# Patient Record
Sex: Male | Born: 1945 | Race: White | Hispanic: No | Marital: Married | Smoking: Former smoker
Health system: Southern US, Community
[De-identification: ages and names within clinical notes are randomized; demographics above are authoritative.]

## PROBLEM LIST (undated history)

## (undated) ENCOUNTER — Ambulatory Visit: Discharge status: Absent without leave | Payer: Medicare Other

## (undated) DIAGNOSIS — F0390 Unspecified dementia without behavioral disturbance: Secondary | ICD-10-CM

## (undated) DIAGNOSIS — I10 Essential (primary) hypertension: Secondary | ICD-10-CM

## (undated) DIAGNOSIS — I2510 Atherosclerotic heart disease of native coronary artery without angina pectoris: Secondary | ICD-10-CM

## (undated) DIAGNOSIS — E785 Hyperlipidemia, unspecified: Secondary | ICD-10-CM

## (undated) HISTORY — PX: TONSILLECTOMY: SUR1361

## (undated) HISTORY — PX: CARDIAC SURGERY: SHX584

## (undated) HISTORY — PX: OTHER SURGICAL HISTORY: SHX169

## (undated) HISTORY — PX: BILATERAL CARPAL TUNNEL RELEASE: SHX6508

---

## 1998-04-05 ENCOUNTER — Ambulatory Visit (HOSPITAL_BASED_OUTPATIENT_CLINIC_OR_DEPARTMENT_OTHER): Admission: RE | Admit: 1998-04-05 | Discharge: 1998-04-05 | Payer: Self-pay | Admitting: Orthopedic Surgery

## 1998-06-01 ENCOUNTER — Emergency Department (HOSPITAL_COMMUNITY): Admission: EM | Admit: 1998-06-01 | Discharge: 1998-06-01 | Payer: Self-pay | Admitting: *Deleted

## 1998-06-04 ENCOUNTER — Emergency Department (HOSPITAL_COMMUNITY): Admission: EM | Admit: 1998-06-04 | Discharge: 1998-06-04 | Payer: Self-pay | Admitting: Emergency Medicine

## 1998-06-08 ENCOUNTER — Ambulatory Visit (HOSPITAL_COMMUNITY): Admission: RE | Admit: 1998-06-08 | Discharge: 1998-06-08 | Payer: Self-pay | Admitting: General Surgery

## 1998-06-21 ENCOUNTER — Ambulatory Visit (HOSPITAL_COMMUNITY): Admission: RE | Admit: 1998-06-21 | Discharge: 1998-06-21 | Payer: Self-pay | Admitting: General Surgery

## 2001-06-26 ENCOUNTER — Ambulatory Visit (HOSPITAL_BASED_OUTPATIENT_CLINIC_OR_DEPARTMENT_OTHER): Admission: RE | Admit: 2001-06-26 | Discharge: 2001-06-26 | Payer: Self-pay | Admitting: Orthopedic Surgery

## 2002-02-19 ENCOUNTER — Inpatient Hospital Stay (HOSPITAL_COMMUNITY): Admission: AD | Admit: 2002-02-19 | Discharge: 2002-03-01 | Payer: Self-pay | Admitting: Internal Medicine

## 2002-02-20 ENCOUNTER — Encounter: Payer: Self-pay | Admitting: Cardiology

## 2002-02-22 ENCOUNTER — Encounter: Payer: Self-pay | Admitting: Surgery

## 2002-02-23 ENCOUNTER — Encounter: Payer: Self-pay | Admitting: Surgery

## 2002-02-24 ENCOUNTER — Encounter: Payer: Self-pay | Admitting: Surgery

## 2002-08-13 ENCOUNTER — Ambulatory Visit (HOSPITAL_COMMUNITY): Admission: RE | Admit: 2002-08-13 | Discharge: 2002-08-13 | Payer: Self-pay | Admitting: *Deleted

## 2004-10-24 ENCOUNTER — Other Ambulatory Visit: Payer: Self-pay

## 2004-10-24 ENCOUNTER — Emergency Department: Payer: Self-pay | Admitting: Emergency Medicine

## 2004-12-07 ENCOUNTER — Emergency Department (HOSPITAL_COMMUNITY): Admission: EM | Admit: 2004-12-07 | Discharge: 2004-12-07 | Payer: Self-pay | Admitting: Emergency Medicine

## 2005-01-07 ENCOUNTER — Ambulatory Visit (HOSPITAL_COMMUNITY): Admission: RE | Admit: 2005-01-07 | Discharge: 2005-01-07 | Payer: Self-pay | Admitting: General Surgery

## 2005-01-14 ENCOUNTER — Encounter (INDEPENDENT_AMBULATORY_CARE_PROVIDER_SITE_OTHER): Payer: Self-pay | Admitting: *Deleted

## 2005-01-14 ENCOUNTER — Ambulatory Visit (HOSPITAL_COMMUNITY): Admission: RE | Admit: 2005-01-14 | Discharge: 2005-01-15 | Payer: Self-pay | Admitting: General Surgery

## 2006-10-25 IMAGING — CT CT ABDOMEN W/ CM
1 of 3 series · 15 of 32 positions shown, 20 images · IV contrast (omnipaque)
Comparison: None

ABDOMEN CT WITH CONTRAST:

CLINICAL DATA: Upper abdominal pain
TECHNIQUE: Multidetector CT imaging of the abdomen and pelvis was performed
following the standard protocol during bolus administration of intravenous
contrast.

Contrast:  100 cc Omnipaque 300

[Series 2: routine abdomen · axial · 0.84mm/px · z∈[-528,-118]mm · 15 of 94 slices shown, 20 images]
[im 6/94  soft-tissue]
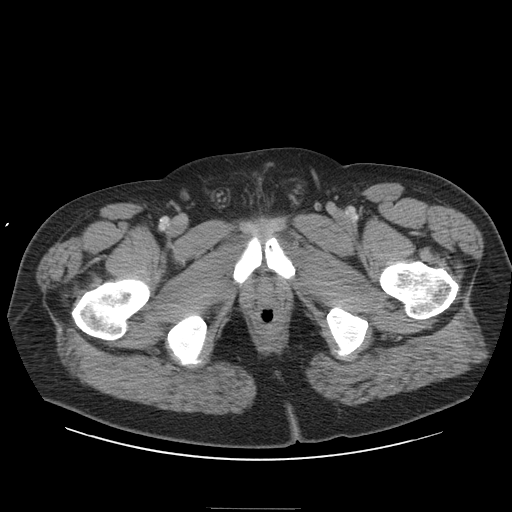
[im 6/94  bone]
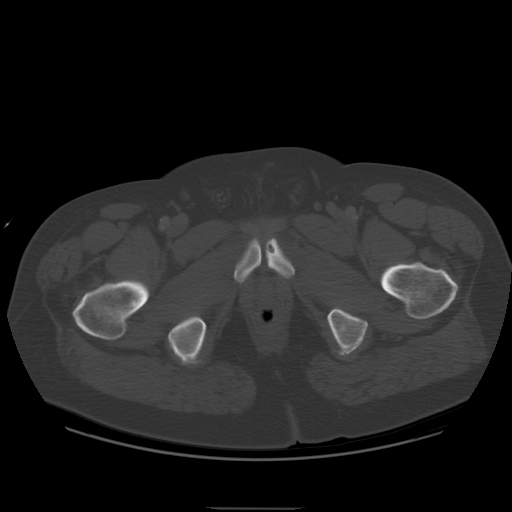
[im 11/94  soft-tissue]
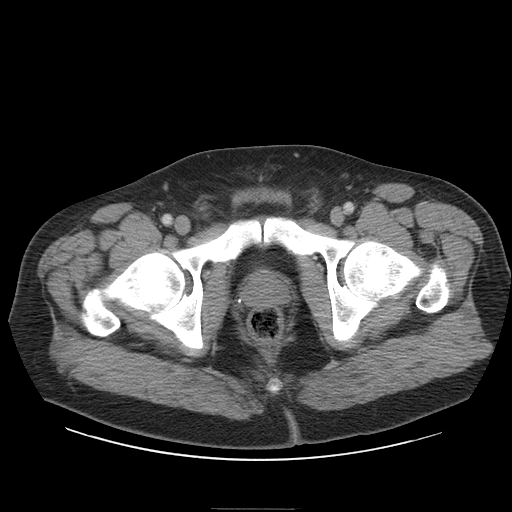
[im 21/94  soft-tissue]
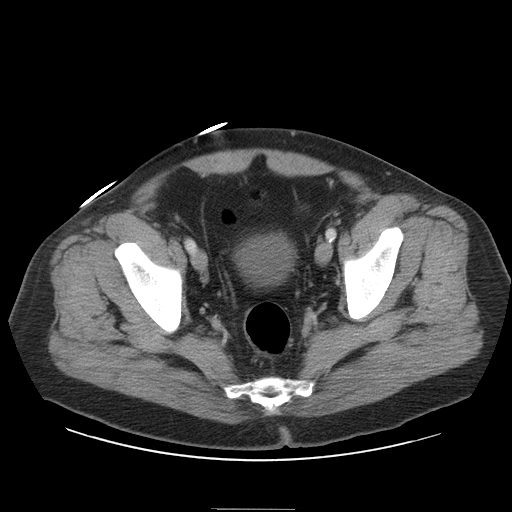
[im 26/94  soft-tissue]
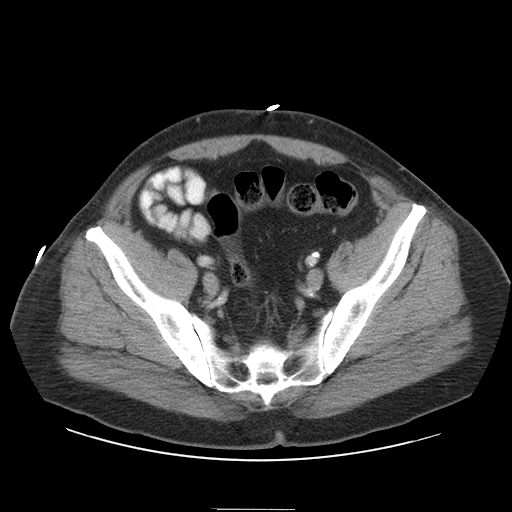
[im 32/94  soft-tissue]
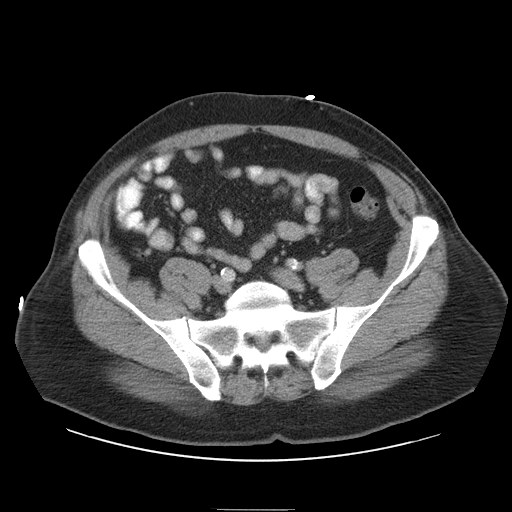
[im 37/94  soft-tissue]
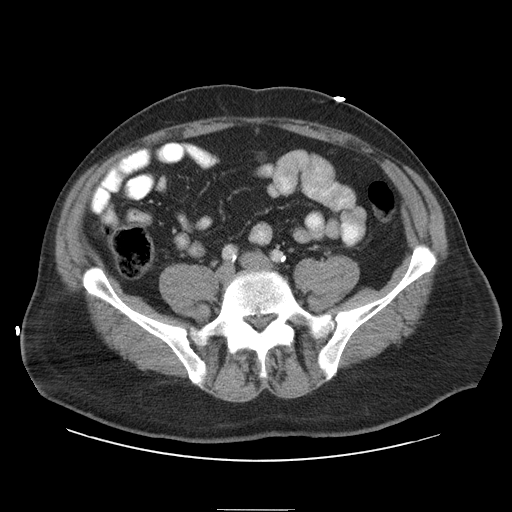
[im 42/94  soft-tissue]
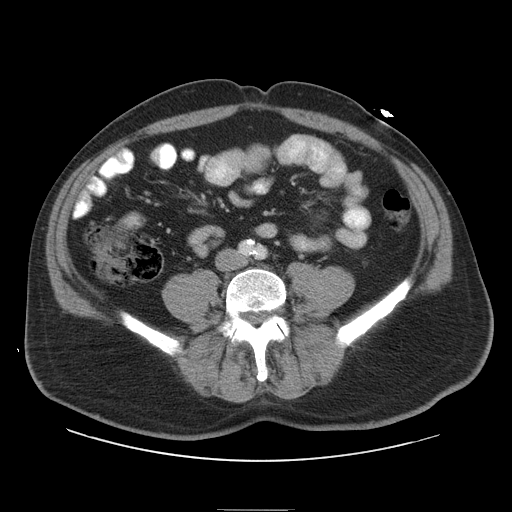
[im 52/94  soft-tissue]
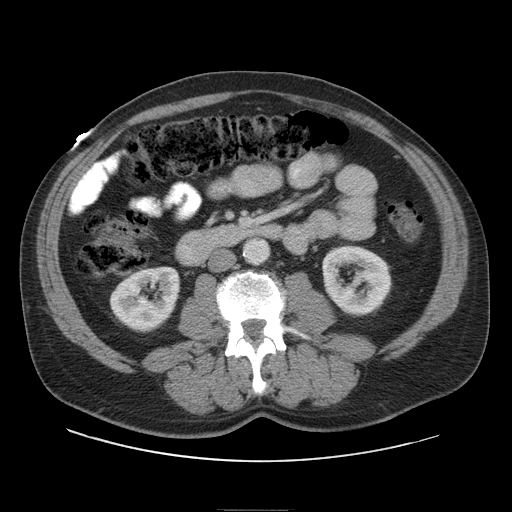
[im 57/94  soft-tissue]
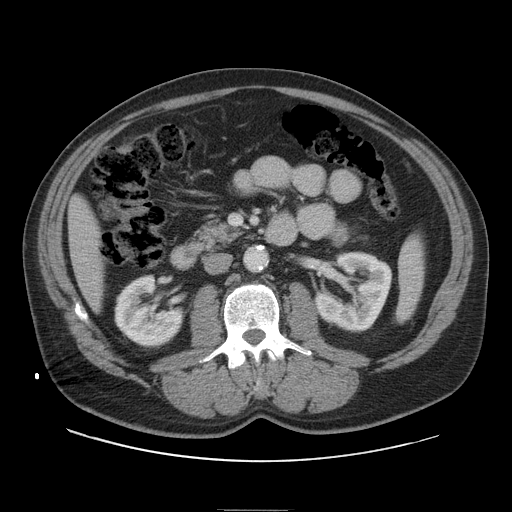
[im 57/94  bone]
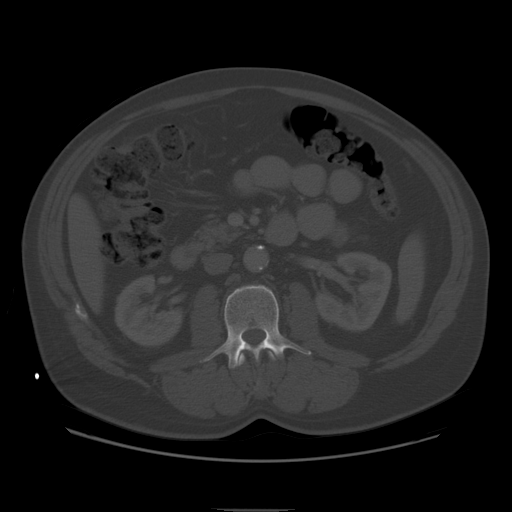
[im 63/94  soft-tissue]
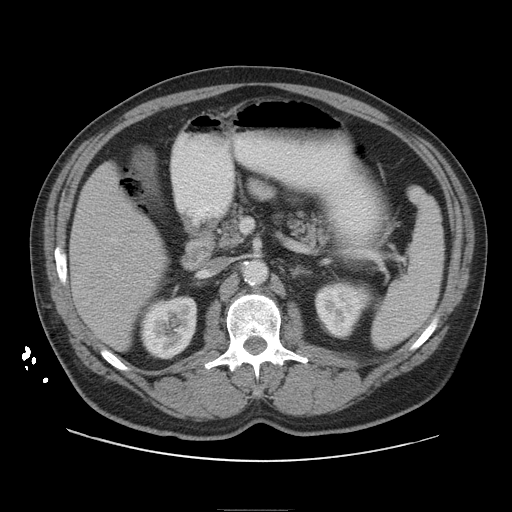
[im 68/94  soft-tissue]
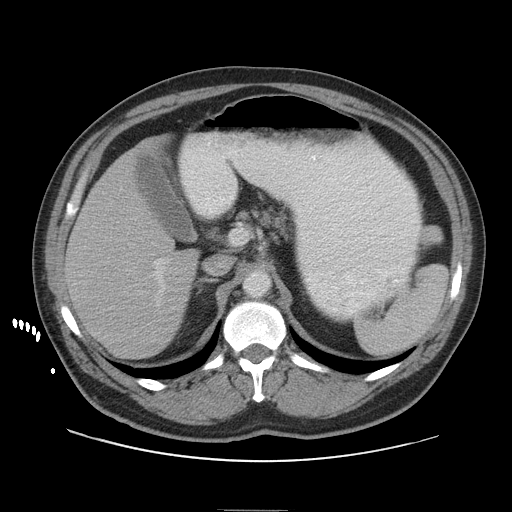
[im 73/94  soft-tissue]
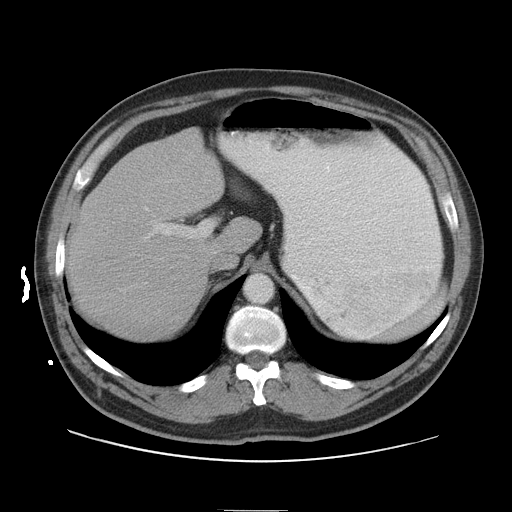
[im 73/94  lung]
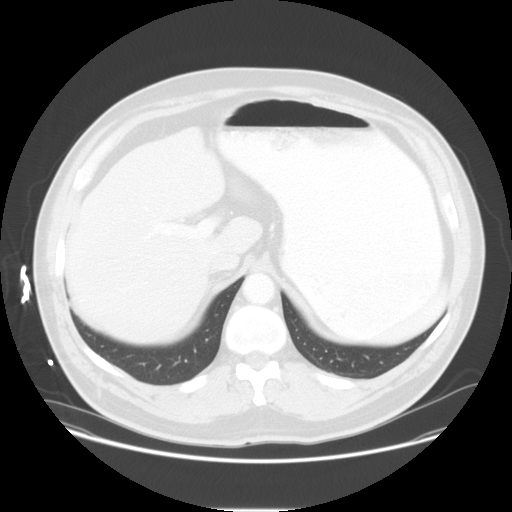
[im 78/94  lung]
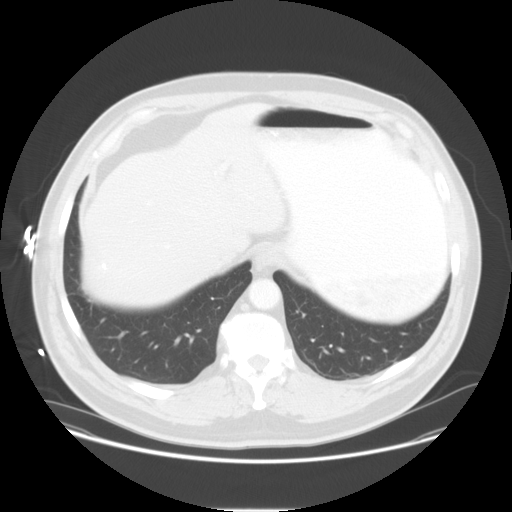
[im 83/94  soft-tissue]
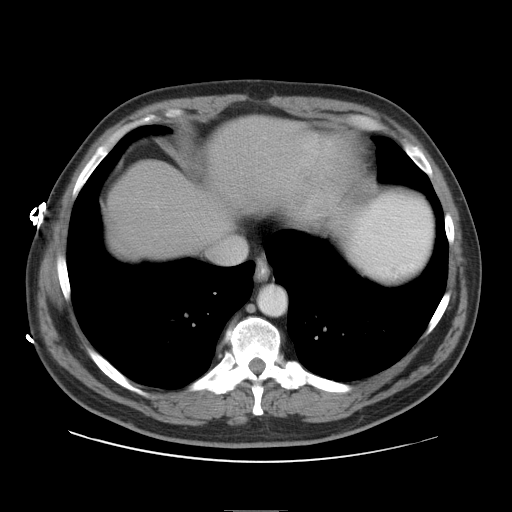
[im 83/94  lung]
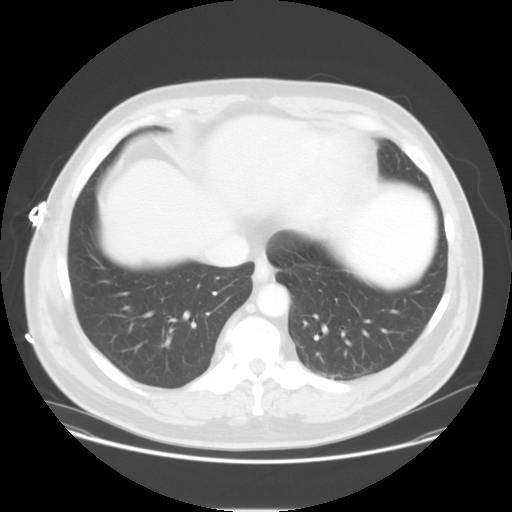
[im 88/94  soft-tissue]
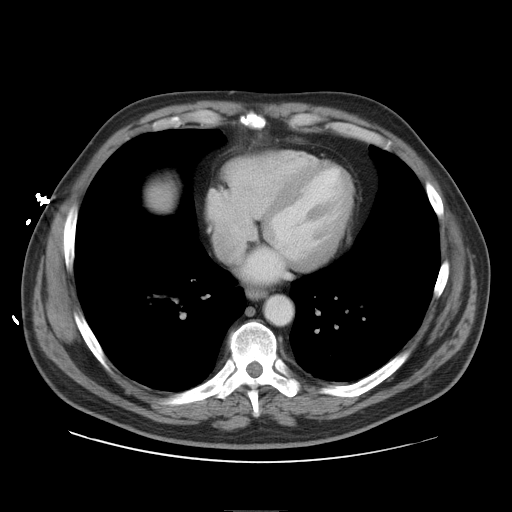
[im 88/94  lung]
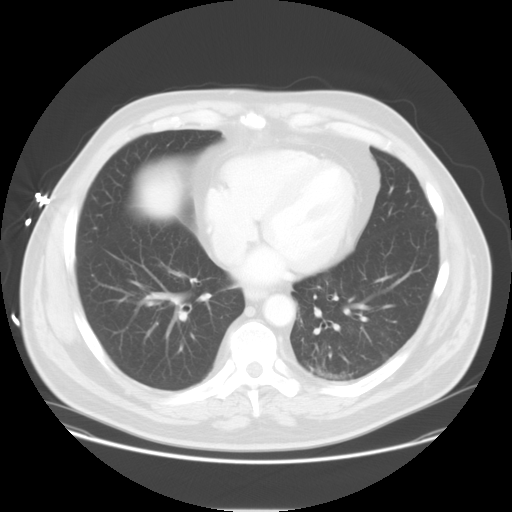

[15 of 32 positions shown; findings below may reference images not displayed]

FINDINGS: The liver, spleen, pancreas, adrenal, and kidneys are unremarkable.
Bowel and gallbladder grossly unremarkable. No free fluid, free air, or
adenopathy. A large amount of stool seen within the colon, particularly the
right side and transverse colon.
IMPRESSION: Constipation, otherwise normal CT abdomen

PELVIS CT WITH CONTRAST:
FINDINGS: Pelvic structures unremarkable. No free fluid, free air, or
adenopathy. Appendix is visualized and is normal.
IMPRESSION: Normal CT pelvis

## 2008-04-14 ENCOUNTER — Encounter: Admission: RE | Admit: 2008-04-14 | Discharge: 2008-04-14 | Payer: Self-pay | Admitting: Emergency Medicine

## 2010-12-08 ENCOUNTER — Encounter (HOSPITAL_BASED_OUTPATIENT_CLINIC_OR_DEPARTMENT_OTHER): Payer: Self-pay | Admitting: General Surgery

## 2011-04-05 NOTE — Op Note (Signed)
NAME:  Jonathan Burgess, Jonathan Burgess NO.:  [id number]   MEDICAL RECORD NO.:  [id number]          PATIENT TYPE:  OIB   LOCATION:  5710                         FACILITY:  [facility]   PHYSICIAN:  Leonie Man, M.D.   DATE OF BIRTH:  09-04-1946   DATE OF PROCEDURE:  01/14/2005  DATE OF DISCHARGE:                                 OPERATIVE REPORT   PREOPERATIVE DIAGNOSIS:  Biliary dyskinesia.   POSTOPERATIVE DIAGNOSIS:  Biliary dyskinesia.   PROCEDURE:  Laparoscopic cholecystectomy.   SURGEON:  Leonie Man, M.D.   ASSISTANT:  Jonathan Burgess, M.D.   ANESTHESIA:  General.   NOTE:  Jonathan Burgess is a 65 year old gentleman who presents with recurrent and  persistent episodes of upper abdominal pain radiating bilaterally into the  subscapular region and associated with nausea and vomiting, this frequently  occurring after fatty food intake.  The patient had an abdominal ultrasound  which did not show any evidence of gallstones.  His hepatobiliary scan with  CCK stimulation shows very low gallbladder ejection fraction but with normal  filling of the cystic duct.  The the patient comes to the operating room now  for laparoscopic cholecystectomy after the risks and potential benefits  surgery had been fully discussed with him, all questions answered and  consent obtained.   PROCEDURE:  Following the induction of satisfactory general anesthesia, the  abdomen is prepped and draped to be included in a sterile operative field.  Open laparoscopy is created at the umbilicus with reduction of a small  umbilical hernia and insertion of a Hasson cannula.  The abdominal cavity is  insufflated to 40 mmHg pressure using carbon dioxide.  The camera is  inserted and visual exploration carried out.  There were some adhesions in  the upper abdomen from prior coronary bypass surgery in which the thoracic  catheters had passed through.  The gallbladder appeared to be moderately  scarred, but without  any significant adhesions.  The liver edges were sharp,  liver surfaces smooth.  None of the small and large intestine viewed  appeared to be abnormal.   Under direct vision, epigastric and lateral ports were placed and the  gallbladder was grasped and retracted cephalad after adhesions to the upper  abdomen were taken down.  The dissection was carried down in the region of  the ampulla of the gallbladder with isolation of the cystic artery and  cystic duct.  The cystic duct was noted to be quite small.  The cystic  artery was doubly clipped and cystic duct clipped proximally and opened. I  attempted a cystic duct cholangiogram using a Cook catheter; however,  because of the small size of the cystic duct, the Cook catheter was a severe  mismatch and a cholangiogram could not be done.  The cystic duct was then  doubly clipped and transected and cystic artery was transected and the  gallbladder then dissected free from the liver bed using electrocautery and  maintaining hemostasis throughout the entire course of dissection with  electrocautery.  At the end of the dissection, all areas within the liver  bed  were checked for hemostasis and additional bleeding points treated with  electrocautery.  The camera was then moved to the epigastric port and the  gallbladder was retrieved through the umbilical port without difficulty.  Sponge, instrument and sharp counts were then verified.  The trocars were  removed under direct vision, the pneumoperitoneum allowed to deflate and the  wounds closed in layers as follows:  The umbilical wound in 2 layers with 0  Vicryl and 4-0 Monocryl, epigastric and flank wounds closed with 4-0  Monocryl sutures.  All wounds were then reinforced with Steri-Strips and  sterile dressings were applied, the anesthetic reversed, patient removed  from the operating room to the recovery room in stable condition.  He  tolerated the procedure well.      PB/MEDQ  D:   01/14/2005  T:  01/15/2005  Job:  [id number]   cc:   Reuben Likes, M.D.  [address]  [place]  [place] [address]  Fax: [phone]

## 2012-01-14 ENCOUNTER — Ambulatory Visit: Payer: Self-pay | Admitting: Family Medicine

## 2014-03-17 ENCOUNTER — Ambulatory Visit: Payer: Self-pay | Admitting: Family Medicine

## 2015-08-20 ENCOUNTER — Encounter: Payer: Self-pay | Admitting: *Deleted

## 2015-08-20 ENCOUNTER — Ambulatory Visit
Admission: EM | Admit: 2015-08-20 | Discharge: 2015-08-20 | Disposition: A | Discharge status: Home / Self Care | Payer: Medicare Other | Attending: Family Medicine | Admitting: Family Medicine

## 2015-08-20 DIAGNOSIS — R05 Cough: Secondary | ICD-10-CM

## 2015-08-20 DIAGNOSIS — R059 Cough, unspecified: Secondary | ICD-10-CM

## 2015-08-20 HISTORY — DX: Essential (primary) hypertension: I10

## 2015-08-20 MED ORDER — HYDROCOD POLST-CPM POLST ER 10-8 MG/5ML PO SUER: 5.0000 mL | Freq: Two times a day (BID) | ORAL | Status: AC | PRN

## 2015-08-20 MED ORDER — BENZONATATE 100 MG PO CAPS: 100.0000 mg | ORAL_CAPSULE | Freq: Three times a day (TID) | ORAL | Status: AC | PRN

## 2015-08-20 MED ORDER — IPRATROPIUM-ALBUTEROL 0.5-2.5 (3) MG/3ML IN SOLN
3.0000 mL | Freq: Once | RESPIRATORY_TRACT | Status: AC
  Administered 2015-08-20: 3 mL via RESPIRATORY_TRACT

## 2015-08-20 NOTE — ED Notes (Signed)
Cough, nasal congestion and general malaise x 4 days.  Has had pneumonia twice.  Unable to sleep at night due to coughing.  No wheezing noted.

## 2015-08-20 NOTE — ED Provider Notes (Signed)
CSN: [id number]     Arrival date & time 08/20/15  1018 History   First MD Initiated Contact with Patient 08/20/15 1110     Chief Complaint  Patient presents with  . Cough   (Consider location/radiation/quality/duration/timing/severity/associated sxs/prior Treatment) HPI 69 yo M presents with his wife for something for cough control Has had chest congestion and throaty cough for a few days Afebrile, no particular malaise, no SOB, no palpitations, no weakness, no dizzy Wife concerned Patient on Aricept-repeats himself occassionally Former smoker As inhaler at home- hasn't tried that Followed by Dr Quillian Quince Hx of CABG, HTN,hyperlipidemia  Past Medical History  Diagnosis Date  . Hypertension    Past Surgical History  Procedure Laterality Date  . Hypercholesterol    . Cardiac surgery    . Tonsillectomy    . Rotator cuff surgery Left   . Bilateral carpal tunnel release Bilateral    Family History  Problem Relation Age of Onset  . Cancer Mother   . Cancer Father    Social History  Substance Use Topics  . Smoking status: Former Smoker    Quit date: 08/19/2005  . Smokeless tobacco: None  . Alcohol Use: No    Review of Systems   Constitutional: No fever.  Eyes: No visual changes. ENT:No sore throat. Cardiovascular:Negative for chest pain/palpitations Respiratory: Negative for shortness of breath Gastrointestinal: No abdominal pain. No nausea,vomiting, diarrhea Genitourinary: Negative for dysuria. Normal urination. Musculoskeletal: Negative for back pain. FROM extremities without pain, no swelling Skin: Negative for rash Neurological: Negative for headache, focal weakness or numbness  Allergies  Lisinopril  Home Medications   Prior to Admission medications   Medication Sig Start Date End Date Taking? Authorizing Provider  benzonatate (TESSALON) 100 MG capsule Take 1 capsule (100 mg total) by mouth 3 (three) times daily as needed. For cough 08/20/15   Rae Halsted, PA-C   chlorpheniramine-HYDROcodone [facility]) 10-8 MG/5ML SUER Take 5 mLs by mouth every 12 (twelve) hours as needed for cough. 08/20/15   Rae Halsted, PA-C   Meds Ordered and Administered this Visit   Medications  ipratropium-albuterol (DUONEB) 0.5-2.5 (3) MG/3ML nebulizer solution 3 mL (3 mLs Nebulization Given 08/20/15 1128)  Well tolerated-- 02 improved from 97 to 99- patient feels better, chest loose  BP 139/65 mmHg  Pulse 78  Temp(Src) 98 F (36.7 C) (Tympanic)  Resp 22  Ht  (1.778 m)  Wt 220 lb (99.791 kg)  BMI 31.57 kg/m2  SpO2 99% No data found.   Physical Exam Constitutional: Alert and oriented, well appearing, VS are noted,  General : No acute distress; Head:normocephalic, atraumatic,  Eyes: conjugate gaze,negative conjunctiva,  Ears:Grossly normal hearing, Bilateral canals and tympanic membranes WNL Nose:normal Mouth/throat :Mucous membranes moist, No pharyngeal erythema,no exudate,  Neck :  supple , no lymphadenopathy, pulse normal Heart: Normal rate, regular rhythm,   Lung:    Normal respiratory effort and rate , no distress- rare cough, non productive, fields are clear- he denies feeling any respiratory difficulty, "just throat cough" Back:    No CVAT, no spinal tenderness noted Abd :    soft, non-tender, no guarding,rebound or organomegaly  appreciated MSK:   nontender, normal ROM all extremities; ambulatory in unit, on and off table without assistance- no pedal edema Neuro:Face symmetric, EOMI, PERRLA,tongue midline. Grossly intact; good attention and recall,repeats himself occassionally during visit, normal gait, normal speech and language Skin:  Warm,dry,intact Psych: mood and affect WNL  ED Course  Procedures (including critical  care time) Medications  ipratropium-albuterol (DUONEB) 0.5-2.5 (3) MG/3ML nebulizer solution 3 mL (3 mLs Nebulization Given 08/20/15 1128)  well tolerated and comfortable afterwards-cough decreased   Sp02  99%  ( 97  on admit) Labs Review Labs Reviewed - No data to display  Imaging Review No results found.  Have discussed the use of his at home inhaler a few times a day while this cough lingers But awareness if SOB develops, or fever, peripheral edema or i ncreased rate of breathing at rest or with activity Need to come back to see [place] - or contact Dr Quillian Quince office if weekday prn. They request codeine cough syrup for bedtime- 1 tsp only- precautions on rising for night bathroom trips-steady Defer daytime use unless staying home,no driving MDM   1. Cough    Diagnosis and treatment discussed.  Questions fielded, expectations and recommendations reviewed . Patient expresses understanding. Will return to [facility] with questions, concern or exacerbation.  Discharge Medication List as of 08/20/2015 11:40 AM    START taking these medications   Details  benzonatate (TESSALON) 100 MG capsule Take 1 capsule (100 mg total) by mouth 3 (three) times daily as needed. For cough, Starting 08/20/2015, Until Discontinued, Print    chlorpheniramine-HYDROcodone (TUSSIONEX PENNKINETIC ER) 10-8 MG/5ML SUER Take 5 mLs by mouth every 12 (twelve) hours as needed for cough., Starting 08/20/2015, Until Discontinued, Print         Rae Halsted, PA-C 08/22/15 1739

## 2015-08-20 NOTE — Discharge Instructions (Signed)
Keep hydrated- steamy showers and fluids by mouth  Follow up with Dr Quillian Quince with a phone call please to triage nurse  See PCP or return to [place] with fever, malaise, increased cough not controlled by current Rx, increased chest congestion  Please be very aware that the liquid cough medicine you requested can make you sleepy and possibly off balance- extra care when rising from bed during the night or in the morning Prefer you use it only at bedtime if the Benzonatate can keep you comfortable in the daytime  Cough, Adult  A cough is a reflex that helps clear your throat and airways. It can help heal the body or may be a reaction to an irritated airway. A cough may only last 2 or 3 weeks (acute) or may last more than 8 weeks (chronic).  CAUSES Acute cough:  Viral or bacterial infections. Chronic cough:  Infections.  Allergies.  Asthma.  Post-nasal drip.  Smoking.  Heartburn or acid reflux.  Some medicines.  Chronic lung problems (COPD).  Cancer. SYMPTOMS   Cough.  Fever.  Chest pain.  Increased breathing rate.  High-pitched whistling sound when breathing (wheezing).  Colored mucus that you cough up (sputum). TREATMENT   A bacterial cough may be treated with antibiotic medicine.  A viral cough must run its course and will not respond to antibiotics.  Your caregiver may recommend other treatments if you have a chronic cough. HOME CARE INSTRUCTIONS   Only take over-the-counter or prescription medicines for pain, discomfort, or fever as directed by your caregiver. Use cough suppressants only as directed by your caregiver.  Use a cold steam vaporizer or humidifier in your bedroom or home to help loosen secretions.  Sleep in a semi-upright position if your cough is worse at night.  Rest as needed.  Stop smoking if you smoke. SEEK IMMEDIATE MEDICAL CARE IF:   You have pus in your sputum.  Your cough starts to worsen.  You cannot control your cough with  suppressants and are losing sleep.  You begin coughing up blood.  You have difficulty breathing.  You develop pain which is getting worse or is uncontrolled with medicine.  You have a fever. MAKE SURE YOU:   Understand these instructions.  Will watch your condition.  Will get help right away if you are not doing well or get worse. Document Released: 05/03/2011 Document Revised: 01/27/2012 Document Reviewed: 05/03/2011 [facility] Patient Information 2015 [place], [place]. This information is not intended to replace advice given to you by your health care provider. Make sure you discuss any questions you have with your health care provider.

## 2016-11-20 ENCOUNTER — Other Ambulatory Visit: Payer: Self-pay | Admitting: Family Medicine

## 2016-11-20 ENCOUNTER — Ambulatory Visit
Admission: RE | Admit: 2016-11-20 | Discharge: 2016-11-20 | Disposition: A | Discharge status: Home / Self Care | Payer: Medicare HMO | Source: Ambulatory Visit | Attending: Family Medicine | Admitting: Family Medicine

## 2016-11-20 DIAGNOSIS — M25532 Pain in left wrist: Secondary | ICD-10-CM | POA: Diagnosis present

## 2017-09-16 ENCOUNTER — Telehealth: Payer: Self-pay | Admitting: *Deleted

## 2017-09-16 NOTE — Telephone Encounter (Signed)
Received referral for low dose lung cancer screening CT scan. Message left at phone number listed in EMR for patient to call me back to facilitate scheduling scan.  

## 2017-09-19 ENCOUNTER — Telehealth: Payer: Self-pay | Admitting: *Deleted

## 2017-09-19 NOTE — Telephone Encounter (Signed)
Received referral for low dose lung cancer screening CT scan. Message left at phone number listed in EMR for patient to call me back to facilitate scheduling scan.  

## 2017-09-30 ENCOUNTER — Telehealth: Payer: Self-pay | Admitting: *Deleted

## 2017-09-30 DIAGNOSIS — Z87891 Personal history of nicotine dependence: Secondary | ICD-10-CM

## 2017-09-30 DIAGNOSIS — Z122 Encounter for screening for malignant neoplasm of respiratory organs: Secondary | ICD-10-CM

## 2017-09-30 NOTE — Telephone Encounter (Signed)
Received referral for initial lung cancer screening scan. Contacted patient and obtained smoking history,(former, quit 08/19/05, 43 pack year) as well as answering questions related to screening process. Patient denies signs of lung cancer such as weight loss or hemoptysis. Patient denies comorbidity that would prevent curative treatment if lung cancer were found. Patient is scheduled for shared decision making visit and CT scan on 10/08/17.

## 2017-10-06 ENCOUNTER — Encounter: Payer: Self-pay | Admitting: *Deleted

## 2017-10-08 ENCOUNTER — Ambulatory Visit
Admission: RE | Admit: 2017-10-08 | Discharge: 2017-10-08 | Disposition: A | Discharge status: Home / Self Care | Payer: Medicare HMO | Source: Ambulatory Visit | Attending: Oncology | Admitting: Oncology

## 2017-10-08 ENCOUNTER — Inpatient Hospital Stay: Payer: Medicare HMO | Attending: Oncology | Admitting: Oncology

## 2017-10-08 DIAGNOSIS — I700 Atherosclerosis of aorta: Secondary | ICD-10-CM | POA: Insufficient documentation

## 2017-10-08 DIAGNOSIS — Z87891 Personal history of nicotine dependence: Secondary | ICD-10-CM | POA: Diagnosis not present

## 2017-10-08 DIAGNOSIS — Z122 Encounter for screening for malignant neoplasm of respiratory organs: Secondary | ICD-10-CM | POA: Diagnosis not present

## 2017-10-08 DIAGNOSIS — J439 Emphysema, unspecified: Secondary | ICD-10-CM | POA: Diagnosis not present

## 2017-10-08 NOTE — Progress Notes (Signed)
In accordance with CMS guidelines, patient has met eligibility criteria including age, absence of signs or symptoms of lung cancer.  Social History   Tobacco Use  . Smoking status: Former Smoker    Packs/day: 1.00    Years: 43.00    Pack years: 43.00    Last attempt to quit: 08/19/2005    Years since quitting: 12.1  Substance Use Topics  . Alcohol use: No  . Drug use: No     A shared decision-making session was conducted prior to the performance of CT scan. This includes one or more decision aids, includes benefits and harms of screening, follow-up diagnostic testing, over-diagnosis, false positive rate, and total radiation exposure.  Counseling on the importance of adherence to annual lung cancer LDCT screening, impact of co-morbidities, and ability or willingness to undergo diagnosis and treatment is imperative for compliance of the program.  Counseling on the importance of continued smoking cessation for former smokers; the importance of smoking cessation for current smokers, and information about tobacco cessation interventions have been given to patient including [place] and [employer] quit [place] programs.  Written order for lung cancer screening with LDCT has been given to the patient and any and all questions have been answered to the best of my abilities.   Yearly follow up will be coordinated by Burgess Estelle, Thoracic Navigator.  Faythe Casa, NP 10/08/2017 1:44 PM

## 2017-10-14 ENCOUNTER — Encounter: Payer: Self-pay | Admitting: *Deleted

## 2018-09-26 ENCOUNTER — Telehealth: Payer: Self-pay

## 2018-09-26 NOTE — Telephone Encounter (Signed)
Call pt regarding lung screening. Left message for pt to return call.  

## 2018-10-03 ENCOUNTER — Telehealth: Payer: Self-pay

## 2018-10-03 NOTE — Telephone Encounter (Signed)
Call pt regarding lung screening. Left message for pt to return call.  

## 2018-10-09 ENCOUNTER — Telehealth: Payer: Self-pay | Admitting: *Deleted

## 2018-10-09 NOTE — Telephone Encounter (Signed)
Left message for patient to notify them that it is time to schedule annual low dose lung cancer screening CT scan. Instructed patient to call back to verify information prior to the scan being scheduled.  

## 2018-10-19 ENCOUNTER — Encounter: Payer: Self-pay | Admitting: *Deleted

## 2018-11-03 ENCOUNTER — Telehealth: Payer: Self-pay | Admitting: *Deleted

## 2018-11-03 DIAGNOSIS — Z122 Encounter for screening for malignant neoplasm of respiratory organs: Secondary | ICD-10-CM

## 2018-11-03 NOTE — Telephone Encounter (Signed)
Patient has been notified that the annual lung cancer screening low dose CT scan is due currently or will be in the near future.  Confirmed that the patient is within the age range of 3855-80, and asymptomatic, and currently exhibits no signs or symptoms of lung cancer.  Patient denies illness that would prevent curative treatment for lung cancer if found.  Verified smoking history, former smoker quit 2006 with a 43pkyr history  .  The shared decision making visit was completed on 10-08-17.  Patient is agreeable for the CT scan to be scheduled.  Will call patient back with date and time of appointment.

## 2018-11-03 NOTE — Telephone Encounter (Signed)
Called patient r/t appt for Monday January 11/23/2018 @ 12:45pm here @ [facility], message left and appt mailed to patient.

## 2018-11-23 ENCOUNTER — Ambulatory Visit
Admission: RE | Admit: 2018-11-23 | Discharge: 2018-11-23 | Disposition: A | Discharge status: Home / Self Care | Payer: Medicare HMO | Source: Ambulatory Visit | Attending: Oncology | Admitting: Oncology

## 2018-11-23 DIAGNOSIS — Z122 Encounter for screening for malignant neoplasm of respiratory organs: Secondary | ICD-10-CM

## 2018-11-24 ENCOUNTER — Encounter: Payer: Self-pay | Admitting: *Deleted

## 2019-02-03 ENCOUNTER — Other Ambulatory Visit: Payer: Self-pay | Admitting: Family Medicine

## 2019-02-03 DIAGNOSIS — I6350 Cerebral infarction due to unspecified occlusion or stenosis of unspecified cerebral artery: Secondary | ICD-10-CM

## 2019-02-08 ENCOUNTER — Other Ambulatory Visit: Payer: Self-pay

## 2019-02-08 ENCOUNTER — Encounter (INDEPENDENT_AMBULATORY_CARE_PROVIDER_SITE_OTHER): Payer: Self-pay

## 2019-02-08 ENCOUNTER — Ambulatory Visit
Admission: RE | Admit: 2019-02-08 | Discharge: 2019-02-08 | Disposition: A | Discharge status: Home / Self Care | Payer: Medicare HMO | Source: Ambulatory Visit | Attending: Family Medicine | Admitting: Family Medicine

## 2019-02-08 DIAGNOSIS — G319 Degenerative disease of nervous system, unspecified: Secondary | ICD-10-CM | POA: Diagnosis not present

## 2019-02-08 DIAGNOSIS — J329 Chronic sinusitis, unspecified: Secondary | ICD-10-CM | POA: Insufficient documentation

## 2019-02-08 DIAGNOSIS — I739 Peripheral vascular disease, unspecified: Secondary | ICD-10-CM | POA: Diagnosis not present

## 2019-02-08 DIAGNOSIS — I6350 Cerebral infarction due to unspecified occlusion or stenosis of unspecified cerebral artery: Secondary | ICD-10-CM | POA: Diagnosis not present

## 2019-02-08 DIAGNOSIS — Z8673 Personal history of transient ischemic attack (TIA), and cerebral infarction without residual deficits: Secondary | ICD-10-CM | POA: Insufficient documentation

## 2019-11-23 ENCOUNTER — Encounter: Payer: Self-pay | Admitting: *Deleted

## 2019-11-23 ENCOUNTER — Telehealth: Payer: Self-pay | Admitting: *Deleted

## 2019-11-23 NOTE — Telephone Encounter (Signed)
Left message for patient to notify them that it is time to schedule annual low dose lung cancer screening CT scan. Instructed patient to call back to verify information prior to the scan being scheduled.  

## 2019-12-24 ENCOUNTER — Telehealth: Payer: Self-pay | Admitting: *Deleted

## 2019-12-24 NOTE — Telephone Encounter (Signed)
Left message for patient to notify them that it is time to schedule annual low dose lung cancer screening CT scan. Instructed patient to call back to verify information prior to the scan being scheduled.  

## 2020-02-08 ENCOUNTER — Telehealth: Payer: Self-pay | Admitting: *Deleted

## 2020-02-08 NOTE — Telephone Encounter (Signed)
(  02/08/20) Pt has been notified that lung cancer screening CT scan is due currently or will be in near future. Confirmed pt is within appropriate age range, and asymptomatic. Pt's wife, who has medical power of attorney, notes that he has a recent dx of dementia, but otherwise denies illness that would prevent curative treatment for lung cancer if found. Verified smoking history (Former Smoker since 2006, 1 ppd). Wife states that it is very difficult to plan/schedule anything b/c his mental state varies day-to-day, and has been trying to keep him at home as much as possible until she can get him vaccinated. She asked that we call her back and try to make a plan on how best to handle his CT this year, and notes that she works [employer] and [employer], so to call earlier in the week SRW

## 2020-03-17 ENCOUNTER — Telehealth: Payer: Self-pay

## 2020-03-17 NOTE — Telephone Encounter (Addendum)
Patient informed and information verified on 02/08/20 regarding lung screening CT scan.  I called to see if they are ready to get scheduled.  Instructed patient to return call to Glenna Fellows at [phone] to have the CT scan scheduled.

## 2020-03-23 ENCOUNTER — Telehealth: Payer: Self-pay

## 2020-03-23 NOTE — Telephone Encounter (Signed)
I contacted patient's wife to attempt to schedule patient's lung screening CT scan.  I left message for wife to contact Glenna Fellows, lung navigator at [phone] if she is ready to schedule the scan.

## 2020-03-29 ENCOUNTER — Telehealth: Payer: Self-pay

## 2020-03-29 NOTE — Telephone Encounter (Signed)
Message left notifying patient that it is time to schedule the low dose lung cancer screening CT scan.  Instructed patient to return call to Shawn Perkins at [phone] to verify information prior to CT scan being scheduled.    

## 2020-04-01 ENCOUNTER — Ambulatory Visit
Admission: EM | Admit: 2020-04-01 | Discharge: 2020-04-01 | Disposition: A | Discharge status: Home / Self Care | Payer: Medicare HMO | Attending: Family Medicine | Admitting: Family Medicine

## 2020-04-01 ENCOUNTER — Ambulatory Visit: Payer: Medicare HMO

## 2020-04-01 ENCOUNTER — Encounter: Payer: Self-pay | Admitting: Emergency Medicine

## 2020-04-01 ENCOUNTER — Other Ambulatory Visit: Payer: Self-pay

## 2020-04-01 DIAGNOSIS — H10021 Other mucopurulent conjunctivitis, right eye: Secondary | ICD-10-CM | POA: Insufficient documentation

## 2020-04-01 DIAGNOSIS — M79674 Pain in right toe(s): Secondary | ICD-10-CM | POA: Insufficient documentation

## 2020-04-01 DIAGNOSIS — M19071 Primary osteoarthritis, right ankle and foot: Secondary | ICD-10-CM | POA: Insufficient documentation

## 2020-04-01 HISTORY — DX: Atherosclerotic heart disease of native coronary artery without angina pectoris: I25.10

## 2020-04-01 HISTORY — DX: Unspecified dementia, unspecified severity, without behavioral disturbance, psychotic disturbance, mood disturbance, and anxiety: F03.90

## 2020-04-01 HISTORY — DX: Hyperlipidemia, unspecified: E78.5

## 2020-04-01 MED ORDER — PREDNISONE 10 MG PO TABS: ORAL_TABLET | ORAL | 0 refills | Status: AC

## 2020-04-01 MED ORDER — MOXIFLOXACIN HCL 0.5 % OP SOLN
1.0000 [drp] | Freq: Three times a day (TID) | OPHTHALMIC | 0 refills | Status: AC
Start: 2020-04-01 — End: ?

## 2020-04-01 MED ORDER — HYDROCODONE-ACETAMINOPHEN 5-325 MG PO TABS: ORAL_TABLET | ORAL | 0 refills | Status: AC

## 2020-04-01 NOTE — Discharge Instructions (Signed)
Over the counter tylenol as needed °

## 2020-04-01 NOTE — ED Triage Notes (Signed)
Patient in today with his wife who states that patient is c/o right foot pain x 5 days. Wife states that she has noticed some bruising and swelling of the right foot, but doesn't know of a specific injury.  Wife also states that patient's right eye was crusty this morning. Wife states she is being treated for conjunctivitis at this time.

## 2020-04-01 NOTE — ED Provider Notes (Signed)
[facility]    CSN: [id number] Arrival date & time: 04/01/20  0836      History   Chief Complaint Chief Complaint  Patient presents with  . Foot Pain    right    HPI Jonathan Burgess is a 74 y.o. male.   74 yo male with a c/o right foot pain for the past 5 days, mainly over the right big toe but also middle of foot. Denies any falls or other traumatic injury. Denies any fever, chills, rash.   Patient also c/o right eye redness and drainage since last night. Wife recently diagnosed with conjunctivitis and currently being treated for it.    Foot Pain    Past Medical History:  Diagnosis Date  . CAD (coronary artery disease)   . Dementia (HCC)   . Hyperlipidemia   . Hypertension     There are no problems to display for this patient.   Past Surgical History:  Procedure Laterality Date  . BILATERAL CARPAL TUNNEL RELEASE Bilateral   . CARDIAC SURGERY    . hypercholesterol    . rotator cuff surgery Left   . TONSILLECTOMY         Home Medications    Prior to Admission medications   Medication Sig Start Date End Date Taking? Authorizing Provider  amLODipine (NORVASC) 10 MG tablet Take 10 mg by mouth daily. 02/02/20  Yes [provider]  aspirin EC 81 MG tablet Take 81 mg by mouth daily.   Yes [provider]  atorvastatin (LIPITOR) 40 MG tablet Take 40 mg by mouth daily. 02/02/20  Yes [provider]  cyclobenzaprine (FLEXERIL) 10 MG tablet Frequency:null   Dosage:0.0     Instructions:10 mg, PO, daily, PRN, 0, 0  Note:Dose: 12/09/07  Yes [provider]  donepezil (ARICEPT) 10 MG tablet Take 10 mg by mouth daily. 02/02/20  Yes [provider]  benzonatate (TESSALON) 100 MG capsule Take 1 capsule (100 mg total) by mouth 3 (three) times daily as needed. For cough 08/20/15   Rae Halsted, PA-C  chlorpheniramine-HYDROcodone [facility] ER) 10-8 MG/5ML SUER Take 5 mLs by mouth every 12 (twelve) hours as  needed for cough. 08/20/15   Rae Halsted, PA-C  HYDROcodone-acetaminophen (NORCO/VICODIN) 5-325 MG tablet 1-2 tabs po bid prn 04/01/20   Payton Mccallum, MD  moxifloxacin (VIGAMOX) 0.5 % ophthalmic solution Place 1 drop into the right eye 3 (three) times daily. 04/01/20   Payton Mccallum, MD  predniSONE (DELTASONE) 10 MG tablet Start 60 mg po day one, then 50 mg po day two, taper by 10 mg daily until complete. 04/01/20   Payton Mccallum, MD    Family History Family History  Problem Relation Age of Onset  . Cancer Mother        breast  . Other Father        head injury after fall  . Alcohol abuse Father     Social History Social History   Tobacco Use  . Smoking status: Former Smoker    Packs/day: 1.00    Years: 43.00    Pack years: 43.00    Quit date: 08/19/2005    Years since quitting: 14.6  . Smokeless tobacco: Never Used  Substance Use Topics  . Alcohol use: Not Currently    Comment: quit 33 years ago  . Drug use: No     Allergies   Lisinopril   Review of Systems Review of Systems   Physical Exam Triage  Vital Signs ED Triage Vitals  Enc Vitals Group     BP 04/01/20 0849 (!) 136/97     Pulse Rate 04/01/20 0849 93     Resp 04/01/20 0849 18     Temp 04/01/20 0849 98.3 F (36.8 C)     Temp Source 04/01/20 0849 Oral     SpO2 04/01/20 0849 97 %     Weight 04/01/20 0850 235 lb (106.6 kg)     Height 04/01/20 0850 5\' 10"  (1.778 m)     Head Circumference --      Peak Flow --      Pain Score 04/01/20 0848 8     Pain Loc --      Pain Edu? --      Excl. in [place]? --    No data found.  Updated Vital Signs BP (!) 136/97 (BP Location: Left Arm)   Pulse 93   Temp 98.3 F (36.8 C) (Oral)   Resp 18   Ht 5\' 10"  (1.778 m)   Wt 106.6 kg   SpO2 97%   BMI 33.72 kg/m   Visual Acuity Right Eye Distance:   Left Eye Distance:   Bilateral Distance:    Right Eye Near:   Left Eye Near:    Bilateral Near:     Physical Exam Vitals and nursing note reviewed.    Constitutional:      General: He is not in acute distress.    Appearance: He is not toxic-appearing or diaphoretic.  Eyes:     General:        Right eye: Discharge present.     Extraocular Movements: Extraocular movements intact.     Conjunctiva/sclera:     Right eye: Right conjunctiva is injected.     Pupils: Pupils are equal, round, and reactive to light.  Cardiovascular:     Rate and Rhythm: Normal rate.  Musculoskeletal:     Right foot: Normal range of motion and normal capillary refill. Tenderness present. No bunion, Charcot foot, prominent metatarsal heads or crepitus. Normal pulse.     Comments: Right foot neurovascularly intact  Neurological:     Mental Status: He is alert.      UC Treatments / Results  Labs (all labs ordered are listed, but only abnormal results are displayed) Labs Reviewed - No data to display  EKG   Radiology DG Foot Complete Right  Result Date: 04/01/2020 CLINICAL DATA:  Pain and swelling for 5 days. EXAM: RIGHT FOOT COMPLETE - 3+ VIEW COMPARISON:  None. FINDINGS: Osseous alignment is normal. No fracture line or displaced fracture fragment. No acute or suspicious osseous lesion. Mild degenerative change at the first MTP and IP joints. Incidental note made of mild spurring at the plantar margin of the posterior calcaneus. Soft tissues about the RIGHT foot are unremarkable. IMPRESSION: 1. No acute findings. 2. Mild degenerative change at the first MTP and IP joints. Electronically Signed   By: Franki Cabot M.D.   On: 04/01/2020 09:42    Procedures Procedures (including critical care time)  Medications Ordered in UC Medications - No data to display  Initial Impression / Assessment and Plan / UC Course  I have reviewed the triage vital signs and the nursing notes.  Pertinent labs & imaging results that were available during my care of the patient were reviewed by me and considered in my medical decision making (see chart for details).       Final Clinical Impressions(s) / UC Diagnoses  Final diagnoses:  Pain of right great toe  Arthritis of right foot  Other mucopurulent conjunctivitis of right eye     Discharge Instructions     Over the counter tylenol as needed    ED Prescriptions    Medication Sig Dispense Auth. Provider   predniSONE (DELTASONE) 10 MG tablet Start 60 mg po day one, then 50 mg po day two, taper by 10 mg daily until complete. 21 tablet Payton Mccallum, MD   HYDROcodone-acetaminophen (NORCO/VICODIN) 5-325 MG tablet 1-2 tabs po bid prn 6 tablet Glynnis Gavel, Pamala Hurry, MD   moxifloxacin (VIGAMOX) 0.5 % ophthalmic solution Place 1 drop into the right eye 3 (three) times daily. 3 mL Payton Mccallum, MD      1. x-ray results and diagnosis reviewed with patient 2. rx as per orders above; reviewed possible side effects, interactions, risks and benefits  3. Recommend supportive treatment with otc tylenol prn 4. Follow-up prn if symptoms worsen or don't improve  I have reviewed the PDMP during this encounter.   Payton Mccallum, MD 04/01/20 1159

## 2020-05-02 ENCOUNTER — Telehealth: Payer: Self-pay | Admitting: *Deleted

## 2020-05-02 NOTE — Telephone Encounter (Signed)
(  05/02/20) Pt's wife has been notified that lung cancer screening CT scan is due currently or will be in near future. Confirmed pt is within appropriate age range, and asymptomatic. Verified smoking history (Former Smoker since 2006, 1 ppd). Pt's wife denies illness that would prevent curative treatment for lung cancer if found, however pt's dementia has rapidly progressed and she can no longer get him out of the house for appts. She is not agreeable for CT scan being scheduled for him this year. SRW

## 2021-08-18 DEATH — deceased
